# Patient Record
Sex: Female | Born: 2005
Health system: Southern US, Community
[De-identification: ages and names within clinical notes are randomized; demographics above are authoritative.]

## PROBLEM LIST (undated history)

## (undated) DIAGNOSIS — L309 Dermatitis, unspecified: Secondary | ICD-10-CM

## (undated) DIAGNOSIS — T7840XA Allergy, unspecified, initial encounter: Secondary | ICD-10-CM

## (undated) HISTORY — DX: Allergy, unspecified, initial encounter: T78.40XA

---
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
## 2024-07-10 ENCOUNTER — Emergency Department (HOSPITAL_BASED_OUTPATIENT_CLINIC_OR_DEPARTMENT_OTHER)

## 2024-07-10 ENCOUNTER — Emergency Department (HOSPITAL_BASED_OUTPATIENT_CLINIC_OR_DEPARTMENT_OTHER)
Admission: EM | Admit: 2024-07-10 | Discharge: 2024-07-10 | Disposition: A | Discharge status: Home / Self Care | Attending: Emergency Medicine | Admitting: Emergency Medicine

## 2024-07-10 ENCOUNTER — Other Ambulatory Visit: Payer: Self-pay

## 2024-07-10 DIAGNOSIS — Z79899 Other long term (current) drug therapy: Secondary | ICD-10-CM | POA: Insufficient documentation

## 2024-07-10 DIAGNOSIS — R102 Pelvic and perineal pain unspecified side: Secondary | ICD-10-CM

## 2024-07-10 DIAGNOSIS — N946 Dysmenorrhea, unspecified: Secondary | ICD-10-CM | POA: Diagnosis not present

## 2024-07-10 LAB — COMPREHENSIVE METABOLIC PANEL WITH GFR
ALT: 9 U/L (ref 0–44)
AST: 18 U/L (ref 15–41)
Albumin: 4.5 g/dL (ref 3.5–5.0)
Alkaline Phosphatase: 72 U/L (ref 38–126)
Anion gap: 11 (ref 5–15)
BUN: 17 mg/dL (ref 6–20)
CO2: 25 mmol/L (ref 22–32)
Calcium: 10 mg/dL (ref 8.9–10.3)
Chloride: 103 mmol/L (ref 98–111)
Creatinine, Ser: 0.91 mg/dL (ref 0.44–1.00)
GFR, Estimated: >60 mL/min
Glucose, Bld: 95 mg/dL (ref 70–99)
Potassium: 4.1 mmol/L (ref 3.5–5.1)
Sodium: 139 mmol/L (ref 135–145)
Total Bilirubin: 0.4 mg/dL (ref 0.0–1.2)
Total Protein: 7.4 g/dL (ref 6.5–8.1)

## 2024-07-10 LAB — CBC WITH DIFFERENTIAL/PLATELET
Abs Immature Granulocytes: 0.02 K/uL (ref 0.00–0.07)
Basophils Absolute: 0 K/uL (ref 0.0–0.1)
Basophils Relative: 1 %
Eosinophils Absolute: 0.2 K/uL (ref 0.0–0.5)
Eosinophils Relative: 2 %
HCT: 39.7 % (ref 36.0–46.0)
Hemoglobin: 13.5 g/dL (ref 12.0–15.0)
Immature Granulocytes: 0 %
Lymphocytes Relative: 13 %
Lymphs Abs: 1.1 K/uL (ref 0.7–4.0)
MCH: 28.4 pg (ref 26.0–34.0)
MCHC: 34 g/dL (ref 30.0–36.0)
MCV: 83.6 fL (ref 80.0–100.0)
Monocytes Absolute: 0.4 K/uL (ref 0.1–1.0)
Monocytes Relative: 5 %
Neutro Abs: 6.8 K/uL (ref 1.7–7.7)
Neutrophils Relative %: 79 %
Platelets: 215 K/uL (ref 150–400)
RBC: 4.75 MIL/uL (ref 3.87–5.11)
RDW: 12.5 % (ref 11.5–15.5)
WBC: 8.5 K/uL (ref 4.0–10.5)
nRBC: 0 % (ref 0.0–0.2)

## 2024-07-10 LAB — URINALYSIS, ROUTINE W REFLEX MICROSCOPIC
Bacteria, UA: NONE SEEN
Bilirubin Urine: NEGATIVE
Glucose, UA: NEGATIVE mg/dL
Ketones, ur: NEGATIVE mg/dL
Leukocytes,Ua: NEGATIVE
Nitrite: NEGATIVE
Protein, ur: NEGATIVE mg/dL
Specific Gravity, Urine: 1.026 (ref 1.005–1.030)
pH: 6 (ref 5.0–8.0)

## 2024-07-10 LAB — PREGNANCY, URINE: Preg Test, Ur: NEGATIVE

## 2024-07-10 LAB — LIPASE, BLOOD: Lipase: 22 U/L (ref 11–51)

## 2024-07-10 MED ORDER — ACETAMINOPHEN 325 MG PO TABS
650.0000 mg | ORAL_TABLET | Freq: Once | ORAL | Status: AC
  Administered 2024-07-10: 650 mg via ORAL
  Filled 2024-07-10: qty 2

## 2024-07-10 MED ORDER — KETOROLAC TROMETHAMINE 15 MG/ML IJ SOLN
15.0000 mg | Freq: Once | INTRAMUSCULAR | Status: AC
  Administered 2024-07-10: 15 mg via INTRAVENOUS
  Filled 2024-07-10: qty 1

## 2024-07-10 MED ORDER — ONDANSETRON HCL 4 MG/2ML IJ SOLN
4.0000 mg | Freq: Once | INTRAMUSCULAR | Status: AC
  Administered 2024-07-10: 4 mg via INTRAVENOUS
  Filled 2024-07-10: qty 2

## 2024-07-10 MED ORDER — LACTATED RINGERS IV BOLUS
1000.0000 mL | Freq: Once | INTRAVENOUS | Status: AC
  Administered 2024-07-10: 1000 mL via INTRAVENOUS

## 2024-07-10 NOTE — ED Notes (Signed)
 Pt given gown and socks. Awaiting MD

## 2024-07-10 NOTE — ED Provider Notes (Signed)
 " [place] EMERGENCY DEPARTMENT AT [facility] Provider Note   CSN: [id number] Arrival date & time: 07/10/24  9197     Patient presents with: Abdominal Pain   Emily Bruce is a 19 y.o. female.    Abdominal Pain Associated symptoms: nausea      19 year old female presenting to the emergency department with a chief complaint of pelvic pain.  The patient states that she woke up this morning around 5 or 6 and had sudden onset lower pelvic pain.  She states that she started her menstrual cycle this morning.  She has been having some intermittent abdominal cramping while not on her menstrual cycle but denies any bleeding while not on her cycle.  She denies any abnormal vaginal discharge.  She is not sexually active.  She denies any burning while urinating or frequency.  Took 600 mg of ibuprofen prior to arrival.  She denies any back pain.  Her last bowel movement was this morning and was normal.  She endorses mild nausea, denies any vomiting.  Prior to Admission medications  Medication Sig Start Date End Date Taking? Authorizing Provider  amitriptyline  (ELAVIL ) 25 MG tablet Take 1 tablet (25 mg total) by mouth at bedtime. 02/10/22   Corinthia Blossom, MD  minocycline (MINOCIN) 100 MG capsule Take 100 mg by mouth daily.    [provider]    Allergies: Patient has no known allergies.    Review of Systems  Gastrointestinal:  Positive for abdominal pain and nausea.  Genitourinary:  Positive for pelvic pain.  All other systems reviewed and are negative.   Updated Vital Signs BP (!) 142/78   Pulse 90   Temp 98.4 F (36.9 C) (Oral)   Resp 18   LMP 07/10/2024 (Exact Date)   SpO2 100%   Physical Exam Vitals and nursing note reviewed.  Constitutional:      General: She is not in acute distress.    Appearance: She is well-developed.  HENT:     Head: Normocephalic and atraumatic.  Eyes:     Conjunctiva/sclera: Conjunctivae normal.  Cardiovascular:     Rate and  Rhythm: Normal rate and regular rhythm.     Heart sounds: No murmur heard. Pulmonary:     Effort: Pulmonary effort is normal. No respiratory distress.     Breath sounds: Normal breath sounds.  Abdominal:     Palpations: Abdomen is soft.     Tenderness: There is no abdominal tenderness. There is no right CVA tenderness or left CVA tenderness.     Comments: Abdomen is soft, nontender, nondistended, no rebound or guarding  Musculoskeletal:        General: No swelling.     Cervical back: Neck supple.  Skin:    General: Skin is warm and dry.     Capillary Refill: Capillary refill takes less than 2 seconds.  Neurological:     Mental Status: She is alert.  Psychiatric:        Mood and Affect: Mood normal.     (all labs ordered are listed, but only abnormal results are displayed) Labs Reviewed  URINALYSIS, ROUTINE W REFLEX MICROSCOPIC - Abnormal; Notable for the following components:      Result Value   Hgb urine dipstick LARGE (*)    All other components within normal limits  CBC WITH DIFFERENTIAL/PLATELET  COMPREHENSIVE METABOLIC PANEL WITH GFR  LIPASE, BLOOD  PREGNANCY, URINE    EKG: None  Radiology: US  PELVIC TRANSABD W/PELVIC DOPPLER Result Date: 07/10/2024 CLINICAL DATA:  Pelvic pain and nausea. EXAM: TRANSVAGINAL ULTRASOUND OF PELVIS DOPPLER ULTRASOUND OF OVARIES TECHNIQUE: Transvaginal ultrasound examination of the pelvis was performed including evaluation of the uterus, ovaries, adnexal regions, and pelvic cul-de-sac. Color and duplex Doppler ultrasound was utilized to evaluate blood flow to the ovaries. COMPARISON:  None Available. FINDINGS: Uterus Measurements: 7.8 cm x 3.7 cm x 4.2 cm = volume: 62.4 mL. No fibroids or other mass visualized. Endometrium Thickness: 10.4 mm.  No focal abnormality visualized. Right ovary Measurements: 3.8 cm x 2.5 cm x 2.6 cm = volume: 13.2 mL. Normal appearance/no adnexal mass. Left ovary Measurements: 3.3 cm x 2.2 cm x 2.7 cm = volume: 10.2  mL. Normal appearance/no adnexal mass. Pulsed Doppler evaluation demonstrates normal low-resistance arterial and venous waveforms in both ovaries. Other findings:  No abnormal free fluid IMPRESSION: Unremarkable pelvic ultrasound. Electronically Signed   By: Suzen Dials M.D.   On: 07/10/2024 11:44     Procedures   Medications Ordered in the ED  ketorolac  (TORADOL ) 15 MG/ML injection 15 mg (has no administration in time range)  acetaminophen  (TYLENOL ) tablet 650 mg (650 mg Oral Given 07/10/24 0909)  lactated ringers  bolus 1,000 mL (1,000 mLs Intravenous New Bag/Given 07/10/24 0907)  ondansetron  (ZOFRAN ) injection 4 mg (4 mg Intravenous Given 07/10/24 0908)                                    Medical Decision Making Amount and/or Complexity of Data Reviewed Labs: ordered. Radiology: ordered.  Risk OTC drugs. Prescription drug management.    19 year old female presenting to the emergency department with a chief complaint of pelvic pain.  The patient states that she woke up this morning around 5 or 6 and had sudden onset lower pelvic pain.  She states that she started her menstrual cycle this morning.  She has been having some intermittent abdominal cramping while not on her menstrual cycle but denies any bleeding while not on her cycle.  She denies any abnormal vaginal discharge.  She is not sexually active.  She denies any burning while urinating or frequency.  Took 600 mg of ibuprofen prior to arrival.  She denies any back pain.  Her last bowel movement was this morning and was normal.  She endorses mild nausea, denies any vomiting.  On arrival, the patient was vitally stable, afebrile, not tachycardic or tachypneic, BP 142/78, saturating 100% on room air.  Patient presenting with pelvic pain that came on suddenly this morning.  Not sexually active.  Currently undergoing her menstrual cycle.  Considered severe cramping associated with her menstrual cycle, endometriosis, ovarian cyst that  is ruptured, less likely ovarian torsion, tubo-ovarian abscess, PID.  Lower concern for appendicitis, pyelonephritis/UTI, nephrolithiasis.  IV access was obtained and the patient was administered IV fluid bolus, IV Zofran  as well as oral Tylenol .  Labs: Urinalysis with hematuria in the setting of her menstrual cycle, urine pregnancy negative, negative for UTI.  CBC without a leukocytosis or anemia, CMP unremarkable, lipase normal.  Pelvic ultrasound: IMPRESSION:  Unremarkable pelvic ultrasound.    Patient states that she is not sexually active, denies any vaginal discharge, not concern for STI, do not think pelvic exam is indicated at this time.  Pelvic ultrasound is overall reassuring.  She has no flank pain to suggest nephrolithiasis.  She has no fever, there is no focal tenderness to palpation in the right lower quadrant to suggest appendicitis.  She  is tolerating oral intake and on repeat assessment is feeling symptomatically improved.  Suspect likely dysmenorrhea/pain in the setting of the patient's onset of menstrual cycle.  Provided return precautions to the event of development of nephrolithiasis or appendicitis, patient overall stable for discharge at this time.     Final diagnoses:  Pelvic pain  Dysmenorrhea    ED Discharge Orders     None          Jerrol Agent, MD 07/10/24 1224  "

## 2024-07-10 NOTE — Discharge Instructions (Addendum)
 Ultrasound imaging and laboratory evaluation was overall reassuring.  Your symptoms resolved in the emergency department and you are tolerating p.o.  Return for any severe worsening right lower quadrant abdominal pain, uncontrolled nausea and vomiting, development of fever or chills, severe right sided or left-sided flank pain as this would be more indicative of appendicitis or a kidney stone and would require a CT scan of the abdomen pelvis to further evaluate.

## 2024-07-10 NOTE — ED Triage Notes (Signed)
 Pt presents c/o lower mid abd pain that began this morning around 0600. Endorses some nausea. Started period this morning. Reports in between last 2 menstrual cycles, she has been having abd cramping while not on period. 600 mg ibuprofen ~ 45 mins PTA.Emily Bruce
